# Patient Record
Sex: Female | Born: 1955 | Race: White | Hispanic: No | Marital: Married | Smoking: Never smoker
Health system: Southern US, Community
[De-identification: ages and names within clinical notes are randomized; demographics above are authoritative.]

## PROBLEM LIST (undated history)

## (undated) DIAGNOSIS — M797 Fibromyalgia: Secondary | ICD-10-CM

## (undated) DIAGNOSIS — G43909 Migraine, unspecified, not intractable, without status migrainosus: Secondary | ICD-10-CM

## (undated) DIAGNOSIS — K219 Gastro-esophageal reflux disease without esophagitis: Secondary | ICD-10-CM

## (undated) DIAGNOSIS — N950 Postmenopausal bleeding: Secondary | ICD-10-CM

## (undated) DIAGNOSIS — G4730 Sleep apnea, unspecified: Secondary | ICD-10-CM

## (undated) HISTORY — PX: DILATION AND CURETTAGE OF UTERUS: SHX78

## (undated) HISTORY — DX: Sleep apnea, unspecified: G47.30

## (undated) HISTORY — DX: Migraine, unspecified, not intractable, without status migrainosus: G43.909

## (undated) HISTORY — DX: Fibromyalgia: M79.7

## (undated) HISTORY — PX: FOOT SURGERY: SHX648

## (undated) HISTORY — PX: NASAL SEPTUM SURGERY: SHX37

## (undated) HISTORY — PX: CARPAL TUNNEL RELEASE: SHX101

---

## 2014-11-01 DIAGNOSIS — M797 Fibromyalgia: Secondary | ICD-10-CM | POA: Insufficient documentation

## 2016-01-30 ENCOUNTER — Ambulatory Visit (INDEPENDENT_AMBULATORY_CARE_PROVIDER_SITE_OTHER): Payer: BLUE CROSS/BLUE SHIELD | Admitting: Family Medicine

## 2016-01-30 ENCOUNTER — Encounter: Payer: Self-pay | Admitting: Family Medicine

## 2016-01-30 VITALS — BP 113/79 | HR 87 | Temp 98.4°F | Ht 64.0 in | Wt 212.6 lb

## 2016-01-30 DIAGNOSIS — R232 Flushing: Secondary | ICD-10-CM | POA: Diagnosis not present

## 2016-01-30 DIAGNOSIS — M5136 Other intervertebral disc degeneration, lumbar region: Secondary | ICD-10-CM | POA: Diagnosis not present

## 2016-01-30 DIAGNOSIS — Z130 Encounter for screening for diseases of the blood and blood-forming organs and certain disorders involving the immune mechanism: Secondary | ICD-10-CM

## 2016-01-30 DIAGNOSIS — Z131 Encounter for screening for diabetes mellitus: Secondary | ICD-10-CM

## 2016-01-30 DIAGNOSIS — Z13220 Encounter for screening for lipoid disorders: Secondary | ICD-10-CM

## 2016-01-30 DIAGNOSIS — Z1329 Encounter for screening for other suspected endocrine disorder: Secondary | ICD-10-CM

## 2016-01-30 DIAGNOSIS — M797 Fibromyalgia: Secondary | ICD-10-CM

## 2016-01-30 LAB — COMPREHENSIVE METABOLIC PANEL
ALBUMIN: 3.6 g/dL (ref 3.5–5.2)
ALT: 13 U/L (ref 0–35)
AST: 16 U/L (ref 0–37)
Alkaline Phosphatase: 112 U/L (ref 39–117)
BUN: 12 mg/dL (ref 6–23)
CALCIUM: 9 mg/dL (ref 8.4–10.5)
CHLORIDE: 106 meq/L (ref 96–112)
CO2: 26 mEq/L (ref 19–32)
Creatinine, Ser: 0.65 mg/dL (ref 0.40–1.20)
GFR: 98.44 mL/min (ref >60.00)
Glucose, Bld: 111 mg/dL — ABNORMAL HIGH (ref 70–99)
POTASSIUM: 3.9 meq/L (ref 3.5–5.1)
Sodium: 141 mEq/L (ref 135–145)
Total Bilirubin: 0.3 mg/dL (ref 0.2–1.2)
Total Protein: 7.8 g/dL (ref 6.0–8.3)

## 2016-01-30 LAB — LIPID PANEL
CHOLESTEROL: 173 mg/dL (ref 0–200)
HDL: 47.1 mg/dL (ref >39.00)
LDL CALC: 103 mg/dL — AB (ref 0–99)
NonHDL: 125.96
TRIGLYCERIDES: 117 mg/dL (ref 0.0–149.0)
Total CHOL/HDL Ratio: 4
VLDL: 23.4 mg/dL (ref 0.0–40.0)

## 2016-01-30 LAB — CBC
HEMATOCRIT: 39.9 % (ref 36.0–46.0)
Hemoglobin: 13.5 g/dL (ref 12.0–15.0)
MCHC: 33.8 g/dL (ref 30.0–36.0)
MCV: 93 fl (ref 78.0–100.0)
PLATELETS: 291 10*3/uL (ref 150.0–400.0)
RBC: 4.29 Mil/uL (ref 3.87–5.11)
RDW: 13.2 % (ref 11.5–15.5)
WBC: 7.3 10*3/uL (ref 4.0–10.5)

## 2016-01-30 LAB — TSH: TSH: 3.76 u[IU]/mL (ref 0.35–4.50)

## 2016-01-30 LAB — HEMOGLOBIN A1C: Hgb A1c MFr Bld: 5.6 % (ref 4.6–6.5)

## 2016-01-30 MED ORDER — DULOXETINE HCL 30 MG PO CPEP: 30.0000 mg | ORAL_CAPSULE | Freq: Two times a day (BID) | ORAL | 3 refills | Status: DC

## 2016-01-30 MED ORDER — NAPROXEN 500 MG PO TABS: 500.0000 mg | ORAL_TABLET | Freq: Two times a day (BID) | ORAL | 3 refills | Status: AC

## 2016-01-30 MED ORDER — CYCLOBENZAPRINE HCL 10 MG PO TABS
ORAL_TABLET | ORAL | 1 refills | Status: AC
Start: 2016-01-30 — End: ?

## 2016-01-30 NOTE — Progress Notes (Signed)
Pre visit review using our clinic review tool, if applicable. No additional management support is needed unless otherwise documented below in the visit note. 

## 2016-01-30 NOTE — Progress Notes (Signed)
[facility] [address], [address] [place], [place] [id number] [phone] [phone]  Date:  01/30/2016   Name:  Jessica Herring   DOB:  Jan 08, 1956   MRN:  [id number]  PCP:  Abbe AmsterdamOPLAND,JESSICA, MD    Chief Complaint: Establish Care (Pt here to est care. Pt would like to get new rx for Duloxetine 30 MG and would like rx for Flexeril and naproxen sodium. )   History of Present Illness:  Jessica Herring is a 60 y.o. very pleasant female patient who presents with the following:  Here today as a new patient- she [facility]. History of FBM, migraine, sleep apnea.   She has lived in this area for most of her life.    Most recent labs: she thinks 11/2014.  She is not fasting today.    She is on cymbalta, and she has DDD also.  She has used some of her husband's naproxen and flexeril; this does help with her sx as does heat.  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  She often feels tired after work. She does used CPAP  She has noted possible hot flashes- she went through menopause about 10 years ago and did not have hot flashes until recently.  She has noted that her general tendency towards being "cold natured" has changes and she gets hot more easily  No weight changes over the last 15 years.    She takes cymbalta 30 2 daily. She has used flexeril as needed- maybe once a week after work.  She cannot take it late at night or she cannot get up for work She uses naproxen about twice a day  She does not have any history of elevated cholesterol.  There was a question of WPW in the past- however she had a stress that was negative.  Her stress was done a few years ago-   Last pap was last year, last mammo was about 18  months ago.   She is having some stressors in her marriage- they plan to start marriage counseling which she hopes will be helpful for them  There are no active problems to display for this patient.   Past Medical History:  Diagnosis Date  . Fibromyalgia   . Migraine   . Sleep apnea     No past surgical history on file.  Social History  Substance Use Topics  . Smoking status: Not on file  . Smokeless tobacco: Not on file  . Alcohol use Not on file    No family history on file.  No Known Allergies  Medication list has been reviewed and updated.  No current outpatient prescriptions on file prior to visit.   No current facility-administered medications on file prior to visit.     Review of Systems: Vitals:   01/30/16 0902  BP: 113/79  Pulse: 87  Temp: 98.4 F (36.9 C)    As per HPI- otherwise negative. No CP or SOB She has never had any issues with HTN or CAD   Physical Examination: Ideal Body Weight:    GEN: WDWN, NAD, Non-toxic, A & O x 3, overweight, looks well HEENT: Atraumatic, Normocephalic. Neck supple. No masses,  No LAD.  Bilateral TM wnl, oropharynx normal.  PEERL,EOMI.   Ears and Nose: No external deformity. CV: RRR, No M/G/R. No JVD. No thrill. No extra heart sounds. PULM: CTA B, no wheezes, crackles, rhonchi. No retractions. No resp. distress. No accessory muscle use. EXTR: No c/c/e NEURO Normal gait.  PSYCH: Normally interactive. Conversant. Not depressed or anxious appearing.  Calm demeanor.    Assessment and Plan: Degenerative disc disease, lumbar - Plan: naproxen (NAPROSYN) 500 MG tablet, cyclobenzaprine (FLEXERIL) 10 MG tablet  Fibromyalgia - Plan: DULoxetine (CYMBALTA) 30 MG capsule  Screening for deficiency anemia - Plan: CBC  Screening for diabetes mellitus - Plan: Comprehensive metabolic panel, Hemoglobin A1C  Screening for hyperlipidemia - Plan: Lipid panel  Screening for thyroid disorder - Plan: TSH  Hot flashes - Plan:  TSH  Here today to establish care Medication refills as above She has used some naproxen and flexeril for her back pain; refills these for her today and also her cymbalta Will plan further follow- up pending labs. Plan to do a CPE in the next several months Encouraged mammogram She plans to do her flu shot later in the season as per her usual routine  Signed Abbe Amsterdam, MD

## 2016-01-30 NOTE — Patient Instructions (Addendum)
We will get screening labs for you today- I'll be in touch with your results asap You can use the flexeril as needed for pain- however I could not combine this with Remus Lofflerambien You can use the naproxen as needed for pains as well- watch for any sign of stomach irritation however.   You can have your mammogram done here at our imaging dept if you like- you can give them a call and schedule at your convenience.   [phone]

## 2016-05-06 ENCOUNTER — Telehealth: Payer: Self-pay | Admitting: Family Medicine

## 2016-05-06 DIAGNOSIS — M797 Fibromyalgia: Secondary | ICD-10-CM

## 2016-05-06 MED ORDER — DULOXETINE HCL 60 MG PO CPEP: 60.0000 mg | ORAL_CAPSULE | Freq: Every day | ORAL | 3 refills | Status: DC

## 2016-05-06 NOTE — Telephone Encounter (Signed)
-----   Message from Cammy Copaanesha N [place], [place] sent at 05/06/2016 10:07 AM EST ----- Regarding: DULOXETINE DR 30MG  CAPSULES Received prior authorization letter stating that "Plan does not cover DOSING >1.0 PER DAY.   Would you like me to start a prior authorization to see if we can get it approved? Please advise.

## 2016-05-06 NOTE — Telephone Encounter (Signed)
Called pt and LMOM- I am going to change her cymbalta to 60 mg once a day instead of 30 BID for insurance purposes.  Let me know if any question/ concner

## 2016-05-13 ENCOUNTER — Other Ambulatory Visit: Payer: Self-pay | Admitting: Emergency Medicine

## 2016-05-13 ENCOUNTER — Telehealth: Payer: Self-pay | Admitting: Family Medicine

## 2016-05-13 ENCOUNTER — Encounter: Payer: Self-pay | Admitting: Emergency Medicine

## 2016-05-13 MED ORDER — DULOXETINE HCL 30 MG PO CPEP: 30.0000 mg | ORAL_CAPSULE | Freq: Two times a day (BID) | ORAL | 3 refills | Status: DC

## 2016-05-13 NOTE — Telephone Encounter (Signed)
Pt states that she had tried taking 60 mg capsules in the past and it has made her extremely dizzy. Taking 30 mg capsules twice daily works better per pt. Pt is aware that her insurance does not cover dosing greater than 1 capsule per day. Pt would like to know if we could call her insurance.  I called pt's insurance and was able to get Duloxetine 30 mg capsules BID approved and is good from 05/13/2016 until 04/12/2017. [place] approval #[id number].  Called pharmacy and pt to inform of this approval.

## 2016-05-13 NOTE — Telephone Encounter (Signed)
-----   Message from Cammy Copaanesha N [place], [place] sent at 05/13/2016 10:33 AM EST ----- Pt states that she had tried taking 60 mg capsules in the past and it has made her extremely dizzy. Taking 30 mg capsules twice daily works better per pt. Pt is aware that her insurance does not cover dosing greater than 1 capsule per day. Pt would like to know if we could call her insurance.  I called pt's insurance and was able to get Duloxetine 30 mg capsules BID approved and is good from 05/13/2016 until 04/12/2017. PA approval #[id number].  Called pharmacy and pt to inform of this approval.

## 2016-08-18 ENCOUNTER — Telehealth: Payer: Self-pay | Admitting: Family Medicine

## 2016-08-18 NOTE — Telephone Encounter (Signed)
Caller name:Aijah Mathison Relationship to patient:[phone] Can be reached: Pharmacy:[facility] on [address] [place]  Reason for call:Requesting refill on Duloxetine , having pain in leg pain, thinks its her sciatica, whom should she go to get this addressed? Has seen Dr Freddi Che and stated she has DDD. Unable to handle this pain, please advise.

## 2016-08-19 MED ORDER — DULOXETINE HCL 30 MG PO CPEP: 30.0000 mg | ORAL_CAPSULE | Freq: Two times a day (BID) | ORAL | 5 refills | Status: DC

## 2016-08-19 NOTE — Telephone Encounter (Signed)
Called her back- I refilled her cymbalta.  Discussed her back pain- she may follow-up with her orthopedist Dr. Wyline Mood or see me for evaluation

## 2016-08-19 NOTE — Telephone Encounter (Signed)
Patient is calling back to follow up on message left yesterday. Request call back to inform her if she needs to come in.

## 2016-08-19 NOTE — Telephone Encounter (Signed)
Called pt to inform that provider is out of the office today but that her message would be sent

## 2016-12-03 ENCOUNTER — Encounter: Payer: Self-pay | Admitting: Family Medicine

## 2016-12-03 ENCOUNTER — Ambulatory Visit (INDEPENDENT_AMBULATORY_CARE_PROVIDER_SITE_OTHER): Payer: 59 | Admitting: Family Medicine

## 2016-12-03 VITALS — BP 131/60 | HR 77 | Temp 98.1°F | Ht 64.0 in | Wt 224.2 lb

## 2016-12-03 DIAGNOSIS — N950 Postmenopausal bleeding: Secondary | ICD-10-CM

## 2016-12-03 DIAGNOSIS — R040 Epistaxis: Secondary | ICD-10-CM

## 2016-12-03 NOTE — Progress Notes (Signed)
Pre visit review using our clinic tool,if applicable. No additional management support is needed unless otherwise documented below in the visit note.  

## 2016-12-03 NOTE — Patient Instructions (Signed)
We are going to have you see an OB/GYN as soon as possible!  You need evaluation of your bleeding which may include an endometrial biopsy, pelvic ultrasound, or CT scan.   Let me know if you have any other problems or concerns in the meantime, or if you have any heavy bleeding that concerns you.    I think your nosebleed was likely a coincidence, but if this happens again please alert me

## 2016-12-03 NOTE — Progress Notes (Addendum)
[facility] [address], [address] [place], [place] [address] [phone]  Date:  12/03/2016   Name:  Jessica Herring   DOB:  04/01/1956   MRN:  [id number]  PCP:  Pearline Cables, MD    Chief Complaint: Vaginal Bleeding (Spotting)   History of Present Illness:  Jessica Herring is a 61 y.o. very pleasant female patient who presents with the following:  She is here today for a follow-up visit She went to the beach several times over the summer which was great She has noted some vaginal spotting for about 3 few days- was just a little pink at first but became more distinct so she came in She tends to wear a liner/ mini pad anyway in case of any urinary spotting Never had any bleeding since menopause  Never had any GYN surgery She has note noted any urinary sx except for "a little bit of burning, just one time," this has not persisted and she does not suspect a UTI currently   Never had any abnl pap in the past.  Last pap about 18 months ago  She does feel that the bleeding is vaginal and not rectal  She is otherwise feeling well, except she had a nosebleed during the night last night.  This is not unheard of for her and she was able to get it under control No other unusual bleeding or bruising She is not taking any blood thinners   Patient Active Problem List   Diagnosis Date Noted  . Fibromyalgia 11/01/2014    Past Medical History:  Diagnosis Date  . Fibromyalgia   . Migraine   . Sleep apnea     Past Surgical History:  Procedure Laterality Date  . CARPAL TUNNEL RELEASE    . FOOT SURGERY    . NASAL SEPTUM SURGERY      Social History  Substance Use Topics  . Smoking status: Not on file  . Smokeless tobacco: Not on file  . Alcohol use Yes    Family History  Problem Relation Age of Onset  . Lung cancer Mother   . Lupus Mother   . Stroke Father        cause of death  . Heart disease Paternal Grandfather      No Known Allergies  Medication list has been reviewed and updated.  Current Outpatient Prescriptions on File Prior to Visit  Medication Sig Dispense Refill  . cyclobenzaprine (FLEXERIL) 10 MG tablet Take 1 daily as needed for pain 30 tablet 1  . DULoxetine (CYMBALTA) 30 MG capsule Take 1 capsule (30 mg total) by mouth 2 (two) times daily. 60 capsule 5  . ipratropium (ATROVENT) 0.06 % nasal spray Place 2 sprays into both nostrils 4 (four) times daily as needed for rhinitis.    . meloxicam (MOBIC) 15 MG tablet Take 15 mg by mouth daily as needed for pain.    . montelukast (SINGULAIR) 10 MG tablet Take 10 mg by mouth at bedtime.    . naproxen (NAPROSYN) 500 MG tablet Take 1 tablet (500 mg total) by mouth 2 (two) times daily with a meal. 60 tablet 3  . TURMERIC PO Take 1 tablet by mouth daily.    Marland Kitchen zolpidem (AMBIEN) 5 MG tablet Take 1 tablet (5 mg total) by mouth nightly as needed for sleep.     No current facility-administered medications on file prior to visit.     Review of Systems:  As per HPI- otherwise negative.   Physical Examination: Vitals:   12/03/16 1512  BP: 131/60  Pulse: 77  Temp: 98.1 F (36.7 C)  SpO2: 99%   Vitals:   12/03/16 1512  Weight: 224 lb 3.2 oz (101.7 kg)  Height: 5\' 4"  (1.626 m)   Body mass index is 38.48 kg/m. Ideal Body Weight: Weight in (lb) to have BMI = 25: 145.3  GEN: WDWN, NAD, Non-toxic, A & O x 3 HEENT: Atraumatic, Normocephalic. Neck supple. No masses, No LAD. Ears and Nose: No external deformity. CV: RRR, No M/G/R. No JVD. No thrill. No extra heart sounds. PULM: CTA B, no wheezes, crackles, rhonchi. No retractions. No resp. distress. No accessory muscle use. ABD: S, NT, ND, +BS. No rebound. No HSM. EXTR: No c/c/e NEURO Normal gait.  PSYCH: Normally interactive. Conversant. Not depressed or anxious appearing.  Calm demeanor.  Obese, otherwise looks well Pelvic: no vaginal lesions or discharge. Uterus normal, no CMT, no adnexal  tendereness or masses. However she is clearly having uterine bleeding with dark blood present at the cervix and in the vault. No cervical lesion or polyp noted Bleeding is not heavy    Assessment and Plan: Post-menopausal bleeding - Plan: Ambulatory referral to Obstetrics / Gynecology, CBC  Epistaxis  Here today with post- menopausal bleeding.  Explained to pt that this is concerning for endometrial cancer until proven otherwise.  Will set her up to see GYN asap Had planned to collect pap today but bleeding will likely obscure sample She did have a nosebleed last night- this went away and may be coincidental, but she will let me know if she has any other concerning sx or if her vaginal bleeding becomes heavy  Signed Abbe AmsterdamJessica Luba Matzen, MD  Results for orders placed or performed in visit on 12/03/16  CBC  Result Value Ref Range   WBC 10.3 4.0 - 10.5 K/uL   RBC 4.34 3.87 - 5.11 Mil/uL   Platelets 293.0 150.0 - 400.0 K/uL   Hemoglobin 13.9 12.0 - 15.0 g/dL   HCT 52.842.4 41.336.0 - 24.446.0 %   MCV 97.7 78.0 - 100.0 fl   MCHC 32.7 30.0 - 36.0 g/dL   RDW 01.013.7 27.211.5 - 53.615.5 %

## 2016-12-04 LAB — CBC
HEMATOCRIT: 42.4 % (ref 36.0–46.0)
Hemoglobin: 13.9 g/dL (ref 12.0–15.0)
MCHC: 32.7 g/dL (ref 30.0–36.0)
MCV: 97.7 fl (ref 78.0–100.0)
Platelets: 293 10*3/uL (ref 150.0–400.0)
RBC: 4.34 Mil/uL (ref 3.87–5.11)
RDW: 13.7 % (ref 11.5–15.5)
WBC: 10.3 10*3/uL (ref 4.0–10.5)

## 2016-12-10 ENCOUNTER — Ambulatory Visit (INDEPENDENT_AMBULATORY_CARE_PROVIDER_SITE_OTHER): Payer: 59 | Admitting: Obstetrics & Gynecology

## 2016-12-10 ENCOUNTER — Encounter: Payer: Self-pay | Admitting: Obstetrics & Gynecology

## 2016-12-10 VITALS — BP 128/64 | HR 82 | Ht 64.0 in | Wt 222.0 lb

## 2016-12-10 DIAGNOSIS — N950 Postmenopausal bleeding: Secondary | ICD-10-CM

## 2016-12-10 MED ORDER — MISOPROSTOL 200 MCG PO TABS: ORAL_TABLET | ORAL | 0 refills | Status: DC

## 2016-12-10 NOTE — Progress Notes (Signed)
Pt has been seen by Dr Patsy Lager for problem and was referred over. Pt states bleeding has occurred 1 week - 10days. Pt states bleeding has been light since her visit with PCP.

## 2016-12-10 NOTE — Progress Notes (Signed)
   Subjective:    Patient ID: Jessica Herring, female    DOB: 1955/05/07, 61 y.o.   MRN: [id number]  HPI  61 yo MW G0 here for PMB over the last 2 weeks. Menopausal about 61 yo.   Review of Systems Pap utd and normal    Objective:   Physical Exam Pleasant Well nourished, well hydrated white female, no apparent distress Breathing, conversing, and ambulating normally Cervix- stenotic, no lesions      Assessment & Plan:  PMB- schedule gyn u/s EMBX at next visit Pretreat with cytotec

## 2016-12-16 ENCOUNTER — Ambulatory Visit (HOSPITAL_BASED_OUTPATIENT_CLINIC_OR_DEPARTMENT_OTHER)
Admission: RE | Admit: 2016-12-16 | Discharge: 2016-12-16 | Disposition: A | Discharge status: Home / Self Care | Payer: 59 | Source: Ambulatory Visit | Attending: Obstetrics & Gynecology | Admitting: Obstetrics & Gynecology

## 2016-12-16 ENCOUNTER — Encounter (HOSPITAL_BASED_OUTPATIENT_CLINIC_OR_DEPARTMENT_OTHER): Payer: Self-pay

## 2016-12-16 DIAGNOSIS — N950 Postmenopausal bleeding: Secondary | ICD-10-CM | POA: Diagnosis not present

## 2016-12-18 ENCOUNTER — Encounter (HOSPITAL_COMMUNITY): Payer: Self-pay

## 2016-12-18 ENCOUNTER — Ambulatory Visit (INDEPENDENT_AMBULATORY_CARE_PROVIDER_SITE_OTHER): Payer: 59 | Admitting: Obstetrics & Gynecology

## 2016-12-18 ENCOUNTER — Encounter: Payer: Self-pay | Admitting: Obstetrics & Gynecology

## 2016-12-18 VITALS — BP 129/82 | HR 82 | Ht 64.0 in | Wt 223.0 lb

## 2016-12-18 DIAGNOSIS — N950 Postmenopausal bleeding: Secondary | ICD-10-CM

## 2016-12-18 NOTE — Progress Notes (Signed)
   Subjective:    Patient ID: Jessica Herring, female    DOB: [birth year], 61 y.o.   MRN: [id number]  HPI 61 yo MW G0 here for PMB over the last 2 weeks. Menopausal about 61 yo. Her u/s showed a 3 mm uterine lining, but fluid was present in the endometrial canal.  Review of Systems     Objective:   Physical Exam Well nourished, well hydrated white female, no apparent distress Breathing, conversing, and ambulating normally Cervix still stenotic (even after cytotec) that I cannot find a metal dilator here at the office small enough to enter the cervix. The plastic skinny one just bends when pushed at the os.       Assessment & Plan:  PMB-with normal endometrium and VERY stenotic cervix, I have told her that I will do research to see if she truly needs [facility]

## 2016-12-19 ENCOUNTER — Telehealth: Payer: Self-pay

## 2016-12-19 MED ORDER — MISOPROSTOL 200 MCG PO TABS: ORAL_TABLET | ORAL | 0 refills | Status: AC

## 2016-12-19 NOTE — Telephone Encounter (Signed)
Patient called and made aware that Dr. Marice Potterove will need to attempt the Endometrial biopsy in the Operating room. Patient made aware that she will be scheduled and a letter will be sent. (most likely not until Oct 2018). Patient also made aware that she will need to repeat the Cytotec 600mcg the night before her surgery - made her aware I will send a refill to her pharmacy and she can pick it up and just hold it until the night before her surgery. Patient states understanding. Armandina StammerJennifer Howard RNBSN

## 2017-01-19 ENCOUNTER — Encounter (HOSPITAL_BASED_OUTPATIENT_CLINIC_OR_DEPARTMENT_OTHER): Payer: Self-pay | Admitting: *Deleted

## 2017-01-22 ENCOUNTER — Other Ambulatory Visit: Payer: Self-pay

## 2017-01-22 ENCOUNTER — Encounter (HOSPITAL_BASED_OUTPATIENT_CLINIC_OR_DEPARTMENT_OTHER)
Admission: RE | Admit: 2017-01-22 | Discharge: 2017-01-22 | Disposition: A | Discharge status: Home / Self Care | Payer: 59 | Source: Ambulatory Visit | Attending: Obstetrics & Gynecology | Admitting: Obstetrics & Gynecology

## 2017-01-22 ENCOUNTER — Telehealth: Payer: Self-pay

## 2017-01-22 DIAGNOSIS — Z01812 Encounter for preprocedural laboratory examination: Secondary | ICD-10-CM | POA: Diagnosis not present

## 2017-01-22 DIAGNOSIS — N950 Postmenopausal bleeding: Secondary | ICD-10-CM | POA: Insufficient documentation

## 2017-01-22 NOTE — Telephone Encounter (Signed)
Patient called and she has already picked up the prescription for her cytotec and has it ready to take by mouth the night before surgery. Armandina Stammer RNBSN

## 2017-01-22 NOTE — Progress Notes (Signed)
Dr.Turk reviewed EKG- ok for surgery. Also Dr. Desmond Lope did Anesthesia Consult for OSA and pt does not use CPAP, spoke with pt and checked airway- Ok for surgery.

## 2017-01-22 NOTE — Telephone Encounter (Signed)
-----   Message from Allie Bossier, MD sent at 01/21/2017 12:55 PM EDT ----- Can you please make sure that she takes cytotec ORALLY 600 mcg the night before her d&c. Thanks

## 2017-01-28 ENCOUNTER — Ambulatory Visit (HOSPITAL_BASED_OUTPATIENT_CLINIC_OR_DEPARTMENT_OTHER)
Admission: RE | Admit: 2017-01-28 | Discharge: 2017-01-28 | Disposition: A | Discharge status: Home / Self Care | Payer: 59 | Source: Ambulatory Visit | Attending: Obstetrics & Gynecology | Admitting: Obstetrics & Gynecology

## 2017-01-28 ENCOUNTER — Encounter (HOSPITAL_BASED_OUTPATIENT_CLINIC_OR_DEPARTMENT_OTHER): Payer: Self-pay | Admitting: Anesthesiology

## 2017-01-28 ENCOUNTER — Ambulatory Visit (HOSPITAL_BASED_OUTPATIENT_CLINIC_OR_DEPARTMENT_OTHER): Payer: 59 | Admitting: Anesthesiology

## 2017-01-28 ENCOUNTER — Encounter (HOSPITAL_BASED_OUTPATIENT_CLINIC_OR_DEPARTMENT_OTHER)
Admission: RE | Disposition: A | Discharge status: Home / Self Care | Payer: Self-pay | Source: Ambulatory Visit | Attending: Obstetrics & Gynecology

## 2017-01-28 DIAGNOSIS — Z91018 Allergy to other foods: Secondary | ICD-10-CM | POA: Diagnosis not present

## 2017-01-28 DIAGNOSIS — M797 Fibromyalgia: Secondary | ICD-10-CM | POA: Diagnosis not present

## 2017-01-28 DIAGNOSIS — G4730 Sleep apnea, unspecified: Secondary | ICD-10-CM | POA: Diagnosis not present

## 2017-01-28 DIAGNOSIS — N950 Postmenopausal bleeding: Secondary | ICD-10-CM | POA: Insufficient documentation

## 2017-01-28 DIAGNOSIS — N858 Other specified noninflammatory disorders of uterus: Secondary | ICD-10-CM | POA: Insufficient documentation

## 2017-01-28 DIAGNOSIS — K219 Gastro-esophageal reflux disease without esophagitis: Secondary | ICD-10-CM | POA: Insufficient documentation

## 2017-01-28 DIAGNOSIS — Z79899 Other long term (current) drug therapy: Secondary | ICD-10-CM | POA: Insufficient documentation

## 2017-01-28 HISTORY — DX: Postmenopausal bleeding: N95.0

## 2017-01-28 HISTORY — DX: Gastro-esophageal reflux disease without esophagitis: K21.9

## 2017-01-28 HISTORY — PX: HYSTEROSCOPY WITH D & C: SHX1775

## 2017-01-28 SURGERY — DILATATION AND CURETTAGE /HYSTEROSCOPY
Anesthesia: General

## 2017-01-28 MED ORDER — BUPIVACAINE HCL (PF) 0.5 % IJ SOLN
INTRAMUSCULAR | Status: DC | PRN
  Administered 2017-01-28: 20 mL

## 2017-01-28 MED ORDER — DEXAMETHASONE SODIUM PHOSPHATE 10 MG/ML IJ SOLN
INTRAMUSCULAR | Status: AC
  Filled 2017-01-28: qty 1

## 2017-01-28 MED ORDER — LIDOCAINE 2% (20 MG/ML) 5 ML SYRINGE
INTRAMUSCULAR | Status: AC
  Filled 2017-01-28: qty 5

## 2017-01-28 MED ORDER — FENTANYL CITRATE (PF) 100 MCG/2ML IJ SOLN
50.0000 ug | INTRAMUSCULAR | Status: DC | PRN
  Administered 2017-01-28: 100 ug via INTRAVENOUS

## 2017-01-28 MED ORDER — ONDANSETRON HCL 4 MG/2ML IJ SOLN
INTRAMUSCULAR | Status: DC | PRN
  Administered 2017-01-28: 4 mg via INTRAVENOUS

## 2017-01-28 MED ORDER — MIDAZOLAM HCL 2 MG/2ML IJ SOLN
1.0000 mg | INTRAMUSCULAR | Status: DC | PRN
  Administered 2017-01-28: 2 mg via INTRAVENOUS

## 2017-01-28 MED ORDER — KETOROLAC TROMETHAMINE 30 MG/ML IJ SOLN
INTRAMUSCULAR | Status: AC
  Filled 2017-01-28: qty 1

## 2017-01-28 MED ORDER — SCOPOLAMINE 1 MG/3DAYS TD PT72: 1.0000 | MEDICATED_PATCH | Freq: Once | TRANSDERMAL | Status: DC | PRN

## 2017-01-28 MED ORDER — FENTANYL CITRATE (PF) 100 MCG/2ML IJ SOLN: 25.0000 ug | INTRAMUSCULAR | Status: DC | PRN

## 2017-01-28 MED ORDER — KETOROLAC TROMETHAMINE 30 MG/ML IJ SOLN
INTRAMUSCULAR | Status: DC | PRN
  Administered 2017-01-28: 30 mg via INTRAVENOUS

## 2017-01-28 MED ORDER — ONDANSETRON HCL 4 MG/2ML IJ SOLN
INTRAMUSCULAR | Status: AC
  Filled 2017-01-28: qty 2

## 2017-01-28 MED ORDER — SODIUM CHLORIDE 0.9 % IR SOLN
Status: DC | PRN
  Administered 2017-01-28: 1

## 2017-01-28 MED ORDER — MIDAZOLAM HCL 2 MG/2ML IJ SOLN
INTRAMUSCULAR | Status: AC
  Filled 2017-01-28: qty 2

## 2017-01-28 MED ORDER — LIDOCAINE 2% (20 MG/ML) 5 ML SYRINGE
INTRAMUSCULAR | Status: DC | PRN
  Administered 2017-01-28: 100 mg via INTRAVENOUS

## 2017-01-28 MED ORDER — DEXAMETHASONE SODIUM PHOSPHATE 4 MG/ML IJ SOLN
INTRAMUSCULAR | Status: DC | PRN
  Administered 2017-01-28: 10 mg via INTRAVENOUS

## 2017-01-28 MED ORDER — PROPOFOL 10 MG/ML IV BOLUS
INTRAVENOUS | Status: DC | PRN
  Administered 2017-01-28: 200 mg via INTRAVENOUS

## 2017-01-28 MED ORDER — FENTANYL CITRATE (PF) 100 MCG/2ML IJ SOLN
INTRAMUSCULAR | Status: AC
  Filled 2017-01-28: qty 2

## 2017-01-28 MED ORDER — LACTATED RINGERS IV SOLN
INTRAVENOUS | Status: DC
  Administered 2017-01-28: 10:00:00 via INTRAVENOUS

## 2017-01-28 SURGICAL SUPPLY — 18 items
BIPOLAR CUTTING LOOP 21FR (ELECTRODE)
CANISTER SUCT 3000ML PPV (MISCELLANEOUS) ×3 IMPLANT
CLOTH BEACON ORANGE TIMEOUT ST (SAFETY) ×3 IMPLANT
CONTAINER PREFILL 10% NBF 60ML (FORM) ×6 IMPLANT
DILATOR CANAL MILEX (MISCELLANEOUS) ×3 IMPLANT
ELECT REM PT RETURN 9FT ADLT (ELECTROSURGICAL)
ELECTRODE REM PT RTRN 9FT ADLT (ELECTROSURGICAL) IMPLANT
GLOVE BIO SURGEON STRL SZ 6.5 (GLOVE) ×2 IMPLANT
GLOVE BIO SURGEONS STRL SZ 6.5 (GLOVE) ×1
GLOVE BIOGEL PI IND STRL 7.0 (GLOVE) ×1 IMPLANT
GLOVE BIOGEL PI INDICATOR 7.0 (GLOVE) ×2
GOWN STRL REUS W/TWL LRG LVL3 (GOWN DISPOSABLE) ×6 IMPLANT
LOOP CUTTING BIPOLAR 21FR (ELECTRODE) IMPLANT
PACK VAGINAL MINOR WOMEN LF (CUSTOM PROCEDURE TRAY) ×3 IMPLANT
PAD OB MATERNITY 4.3X12.25 (PERSONAL CARE ITEMS) ×3 IMPLANT
TOWEL OR 17X24 6PK STRL BLUE (TOWEL DISPOSABLE) ×6 IMPLANT
TUBING AQUILEX INFLOW (TUBING) ×3 IMPLANT
TUBING AQUILEX OUTFLOW (TUBING) ×3 IMPLANT

## 2017-01-28 NOTE — Anesthesia Preprocedure Evaluation (Addendum)
Anesthesia Evaluation  Patient identified by MRN, date of birth, ID band Patient awake    Reviewed: Allergy & Precautions, H&P , Patient's Chart, lab work & pertinent test results, reviewed documented beta blocker date and time   Airway Mallampati: II  TM Distance: >3 FB Neck ROM: full    Dental no notable dental hx.    Pulmonary    Pulmonary exam normal breath sounds clear to auscultation       Cardiovascular  Rhythm:regular Rate:Normal     Neuro/Psych    GI/Hepatic   Endo/Other    Renal/GU      Musculoskeletal   Abdominal   Peds  Hematology   Anesthesia Other Findings   Reproductive/Obstetrics                             Anesthesia Physical Anesthesia Plan  ASA: III  Anesthesia Plan: General   Post-op Pain Management:    Induction: Intravenous  PONV Risk Score and Plan: 2 and Ondansetron, Dexamethasone and Treatment may vary due to age or medical condition  Airway Management Planned: LMA  Additional Equipment:   Intra-op Plan:   Post-operative Plan:   Informed Consent: I have reviewed the patients History and Physical, chart, labs and discussed the procedure including the risks, benefits and alternatives for the proposed anesthesia with the patient or authorized representative who has indicated his/her understanding and acceptance.   Dental Advisory Given  Plan Discussed with: CRNA and Surgeon  Anesthesia Plan Comments: ( )       Anesthesia Quick Evaluation

## 2017-01-28 NOTE — Discharge Instructions (Signed)
Dilation and Curettage or Vacuum Curettage, Care After °This sheet gives you information about how to care for yourself after your procedure. Your health care provider may also give you more specific instructions. If you have problems or questions, contact your health care provider. °What can I expect after the procedure? °After your procedure, it is common to have: °· Mild pain or cramping. °· Some vaginal bleeding or spotting. ° °These may last for up to 2 weeks after your procedure. °Follow these instructions at home: °Activity ° °· Do not drive or use heavy machinery while taking prescription pain medicine. °· Avoid driving for the first 24 hours after your procedure. °· Take frequent, short walks, followed by rest periods, throughout the day. Ask your health care provider what activities are safe for you. After 1-2 days, you may be able to return to your normal activities. °· Do not lift anything heavier than 10 lb (4.5 kg) until your health care provider approves. °· For at least 2 weeks, or as long as told by your health care provider, do not: °? Douche. °? Use tampons. °? Have sexual intercourse. °General instructions ° °· Take over-the-counter and prescription medicines only as told by your health care provider. This is especially important if you take blood thinning medicine. °· Do not take baths, swim, or use a hot tub until your health care provider approves. Take showers instead of baths. °· Wear compression stockings as told by your health care provider. These stockings help to prevent blood clots and reduce swelling in your legs. °· It is your responsibility to get the results of your procedure. Ask your health care provider, or the department performing the procedure, when your results will be ready. °· Keep all follow-up visits as told by your health care provider. This is important. °Contact a health care provider if: °· You have severe cramps that get worse or that do not get better with  medicine. °· You have severe abdominal pain. °· You cannot drink fluids without vomiting. °· You develop pain in a different area of your pelvis. °· You have bad-smelling vaginal discharge. °· You have a rash. °Get help right away if: °· You have vaginal bleeding that soaks more than one sanitary pad in 1 hour, for 2 hours in a row. °· You pass large blood clots from your vagina. °· You have a fever that is above 100.4°F (38.0°C). °· Your abdomen feels very tender or hard. °· You have chest pain. °· You have shortness of breath. °· You cough up blood. °· You feel dizzy or light-headed. °· You faint. °· You have pain in your neck or shoulder area. °This information is not intended to replace advice given to you by your health care provider. Make sure you discuss any questions you have with your health care provider. °Document Released: 04/04/2000 Document Revised: 12/05/2015 Document Reviewed: 11/08/2015 °Elsevier Interactive Patient Education © 2018 [employer] ° ° °Post Anesthesia Home Care Instructions ° °Activity: °Get plenty of rest for the remainder of the day. A responsible individual must stay with you for 24 hours following the procedure.  °For the next 24 hours, DO NOT: °-Drive a car °-Operate machinery °-Drink alcoholic beverages °-Take any medication unless instructed by your physician °-Make any legal decisions or sign important papers. ° °Meals: °Start with liquid foods such as gelatin or soup. Progress to regular foods as tolerated. Avoid greasy, spicy, heavy foods. If nausea and/or vomiting occur, drink only clear liquids until the nausea   and/or vomiting subsides. Call your physician if vomiting continues. ° °Special Instructions/Symptoms: °Your throat may feel dry or sore from the anesthesia or the breathing tube placed in your throat during surgery. If this causes discomfort, gargle with warm salt water. The discomfort should disappear within 24 hours. ° °If you had a scopolamine patch placed  behind your ear for the management of post- operative nausea and/or vomiting: ° °1. The medication in the patch is effective for 72 hours, after which it should be removed.  Wrap patch in a tissue and discard in the trash. Wash hands thoroughly with soap and water. °2. You may remove the patch earlier than 72 hours if you experience unpleasant side effects which may include dry mouth, dizziness or visual disturbances. °3. Avoid touching the patch. Wash your hands with soap and water after contact with the patch. °  ° ° °

## 2017-01-28 NOTE — Anesthesia Postprocedure Evaluation (Signed)
Anesthesia Post Note  Patient: Jessica Herring  Procedure(s) Performed: DILATATION AND CURETTAGE /HYSTEROSCOPY (N/A )     Patient location during evaluation: PACU Anesthesia Type: General Level of consciousness: awake and alert Pain management: pain level controlled Vital Signs Assessment: post-procedure vital signs reviewed and stable Respiratory status: spontaneous breathing, nonlabored ventilation, respiratory function stable and patient connected to nasal cannula oxygen Cardiovascular status: blood pressure returned to baseline and stable Postop Assessment: no apparent nausea or vomiting Anesthetic complications: no    Last Vitals:  Vitals:   01/28/17 1200 01/28/17 1220  BP: 101/61 (!) 142/76  Pulse: 70 71  Resp: 16 18  Temp:  36.4 C  SpO2: 94% 100%    Last Pain:  Vitals:   01/28/17 1220  TempSrc: Oral  PainSc: 0-No pain                 Undray Allman EDWARD

## 2017-01-28 NOTE — Transfer of Care (Signed)
Immediate Anesthesia Transfer of Care Note  Patient: Jessica Herring  Procedure(s) Performed: DILATATION AND CURETTAGE /HYSTEROSCOPY (N/A )  Patient Location: PACU  Anesthesia Type:General  Level of Consciousness: drowsy  Airway & Oxygen Therapy: Patient Spontanous Breathing and Patient connected to face mask oxygen  Post-op Assessment: Report given to RN and Post -op Vital signs reviewed and stable  Post vital signs: Reviewed and stable  Last Vitals:  Vitals:   01/28/17 1008  BP: (!) 105/52  Pulse: 79  Resp: 17  Temp: 36.7 C  SpO2: 98%    Last Pain:  Vitals:   01/28/17 1008  TempSrc: Oral  PainSc: 1       Patients Stated Pain Goal: 1 (01/28/17 1008)  Complications: No apparent anesthesia complications

## 2017-01-28 NOTE — H&P (Signed)
Jessica Herring is an 61 yo MW G0 here for PMB over the last 2 weeks. Menopausal about 61 yo. Her u/s showed a 3 mm uterine lining, but fluid was present in the endometrial canal.   No LMP recorded. Patient is postmenopausal.    Past Medical History:  Diagnosis Date  . Fibromyalgia   . GERD (gastroesophageal reflux disease)   . Migraine   . PMB (postmenopausal bleeding)   . Sleep apnea    does not use CPAP    Past Surgical History:  Procedure Laterality Date  . CARPAL TUNNEL RELEASE    . DILATION AND CURETTAGE OF UTERUS     8-10 week missed AB per patient  . FOOT SURGERY    . NASAL SEPTUM SURGERY      Family History  Problem Relation Age of Onset  . Lung cancer Mother   . Lupus Mother   . Stroke Father        cause of death  . Heart disease Paternal Grandfather     Social History:  reports that she has never smoked. She has never used smokeless tobacco. She reports that she drinks alcohol. She reports that she does not use drugs.  Allergies:  Allergies  Allergen Reactions  . Banana Nausea And Vomiting  . Onion Nausea And Vomiting  . Tomato Hives    Prescriptions Prior to Admission  Medication Sig Dispense Refill Last Dose  . calcium carbonate (TUMS) 500 MG chewable tablet Chew 1 tablet by mouth daily.   Past Week at Unknown time  . cyclobenzaprine (FLEXERIL) 10 MG tablet Take 1 daily as needed for pain 30 tablet 1 Past Month at Unknown time  . DULoxetine (CYMBALTA) 30 MG capsule Take 1 capsule (30 mg total) by mouth 2 (two) times daily. 60 capsule 5 01/27/2017 at Unknown time  . gabapentin (NEURONTIN) 300 MG capsule Take 300 mg by mouth 3 (three) times daily.    01/28/2017 at 0730  . misoprostol (CYTOTEC) 200 MCG tablet Take 3 pills by mouth the night before surgery. 3 tablet 0 01/27/2017 at Unknown time  . naproxen (NAPROSYN) 500 MG tablet Take 1 tablet (500 mg total) by mouth 2 (two) times daily with a meal. 60 tablet 3 Past Week at Unknown time  . ipratropium  (ATROVENT) 0.06 % nasal spray Place 2 sprays into both nostrils 4 (four) times daily as needed for rhinitis.   More than a month at Unknown time  . montelukast (SINGULAIR) 10 MG tablet Take 10 mg by mouth at bedtime.   More than a month at Unknown time    ROS  Height  (1.626 m), weight 101.2 kg (223 lb). Physical Exam  Heart- rrr Lungs- CTAB Abd- benign  No results found for this or any previous visit (from the past 24 hour(s)).  No results found.  Assessment/Plan: PMB with fluid in the endometrial canal. I have rec'd a d&c and hysteroscopy.  She understands the risks of surgery, including, but not to infection, bleeding, DVTs, damage to bowel, bladder, ureters. She wishes to proceed.      Allie Bossier 01/28/2017, 9:53 AM

## 2017-01-28 NOTE — Op Note (Signed)
01/28/2017  11:07 AM  PATIENT:  Jessica Herring  61 y.o. female  PRE-OPERATIVE DIAGNOSIS:  PMB  POST-OPERATIVE DIAGNOSIS:  PMB  PROCEDURE:  Procedure(s): DILATATION AND CURETTAGE /HYSTEROSCOPY (N/A)  SURGEON:  Surgeon(s) and Role:    * Julieta Rogalski C, MD - Primary   ANESTHESIA:   local and general  EBL:  Total I/O In: 400 [I.V.:400] Out: -   BLOOD ADMINISTERED:none  DRAINS: none   LOCAL MEDICATIONS USED:  MARCAINE     SPECIMEN:  Source of Specimen:  uterine curettings  DISPOSITION OF SPECIMEN:  PATHOLOGY  COUNTS:  YES  TOURNIQUET:  * No tourniquets in log *  DICTATION: .Dragon Dictation  PLAN OF CARE: Discharge to home after PACU  PATIENT DISPOSITION:  PACU - hemodynamically stable.   Delay start of Pharmacological VTE agent (>24hrs) due to surgical blood loss or risk of bleeding: not applicable    The risks, benefits, and alternatives of surgery were explained, understood, and accepted. All questions were answered. Consents were signed. In the operating room general anesthesia was applied without complication, and she was placed in the dorsal lithotomy position. Her vagina was prepped and draped in the usual sterile fashion. A bimanual exam revealed a small uterus and non-palpable adnexa.  A Pederson speculum was placed and a single-tooth tenaculum was used to grasp the anterior lip of her cervix. A total of 20 mL of 0.5% Marcaine was used to perform a paracervical block. Her uterus sounded to 6 cm. Her cervix was carefully and slowly dilated to accommodate a small curette. As I dilated her cervix, a small amount of old dark blood was noted coming out of the cervix. Hysteroscopy showed a mostly atrophic endometrium with a few areas of normal-appearing shagginess.  A curettage was done in all quadrants and the fundus of the uterus. A small amount of tissue was obtained. A gritty sensation was appreciated throughout. There was no bleeding noted at the end of the case. She  was taken to the recovery room after being extubated. She tolerated the procedure well.

## 2017-01-28 NOTE — Anesthesia Procedure Notes (Signed)
Procedure Name: LMA Insertion Date/Time: 01/28/2017 10:41 AM Performed by: Caren Macadam Pre-anesthesia Checklist: Patient identified, Emergency Drugs available, Suction available and Patient being monitored Patient Re-evaluated:Patient Re-evaluated prior to induction Oxygen Delivery Method: Circle system utilized Preoxygenation: Pre-oxygenation with 100% oxygen Induction Type: IV induction Ventilation: Mask ventilation without difficulty LMA: LMA inserted LMA Size: 4.0 Number of attempts: 1 Airway Equipment and Method: Bite block Placement Confirmation: positive ETCO2 and breath sounds checked- equal and bilateral Tube secured with: Tape Dental Injury: Teeth and Oropharynx as per pre-operative assessment

## 2017-01-29 ENCOUNTER — Encounter (HOSPITAL_BASED_OUTPATIENT_CLINIC_OR_DEPARTMENT_OTHER): Payer: Self-pay | Admitting: Obstetrics & Gynecology

## 2017-02-25 ENCOUNTER — Ambulatory Visit (INDEPENDENT_AMBULATORY_CARE_PROVIDER_SITE_OTHER): Payer: 59 | Admitting: Obstetrics & Gynecology

## 2017-02-25 ENCOUNTER — Encounter: Payer: Self-pay | Admitting: Obstetrics & Gynecology

## 2017-02-25 VITALS — BP 110/81 | HR 75 | Wt 223.0 lb

## 2017-02-25 DIAGNOSIS — Z1151 Encounter for screening for human papillomavirus (HPV): Secondary | ICD-10-CM | POA: Diagnosis not present

## 2017-02-25 DIAGNOSIS — Z124 Encounter for screening for malignant neoplasm of cervix: Secondary | ICD-10-CM | POA: Diagnosis not present

## 2017-02-25 DIAGNOSIS — Z9889 Other specified postprocedural states: Secondary | ICD-10-CM

## 2017-02-25 DIAGNOSIS — Z0000 Encounter for general adult medical examination without abnormal findings: Secondary | ICD-10-CM

## 2017-02-25 MED ORDER — DULOXETINE HCL 30 MG PO CPEP: 30.0000 mg | ORAL_CAPSULE | Freq: Two times a day (BID) | ORAL | 5 refills | Status: DC

## 2017-02-25 NOTE — Progress Notes (Signed)
   Subjective:    Patient ID: Jessica Herring, female    DOB: 04-14-1956, 61 y.o.   MRN: [id number]  HPI 61 yo lady here for a post op visit. She has had no post op problems. She recently got a steroid injection in her back. She has had a small amount of spotting.   Review of Systems     Objective:   Physical Exam Breathing, conversing, and ambulating normally Well nourished, well hydrated White female, no apparent distress Small normal appearing cervix       Assessment & Plan:  PMB- thin endometrium, negative pathology, negative hysteroscopy I have given her reassurance that she does not have uterine cancer. If the bleeding is still happening in a year, then she will need another u/s. Pap smear today She declines a flu vaccine now I have refilled her generic cymbalta

## 2017-03-03 LAB — CYTOLOGY - PAP
Diagnosis: NEGATIVE
HPV: NOT DETECTED

## 2017-05-23 MED ORDER — CHOLECALCIFEROL 25 MCG (1000 UT) PO TABS
1000.00 | ORAL_TABLET | ORAL | Status: DC
Start: 2017-05-24 — End: 2017-05-23

## 2017-05-23 MED ORDER — BISACODYL 10 MG RE SUPP
10.00 | RECTAL | Status: DC
Start: ? — End: 2017-05-23

## 2017-05-23 MED ORDER — GENERIC EXTERNAL MEDICATION
1.00 | Status: DC
Start: ? — End: 2017-05-23

## 2017-05-23 MED ORDER — DULOXETINE HCL 30 MG PO CPEP
30.00 | ORAL_CAPSULE | ORAL | Status: DC
Start: 2017-05-24 — End: 2017-05-23

## 2017-05-23 MED ORDER — GABAPENTIN 300 MG PO CAPS
300.00 | ORAL_CAPSULE | ORAL | Status: DC
Start: 2017-05-23 — End: 2017-05-23

## 2017-05-23 MED ORDER — POLYSACCHARIDE IRON COMPLEX 150 MG PO CAPS
150.00 | ORAL_CAPSULE | ORAL | Status: DC
Start: 2017-05-24 — End: 2017-05-23

## 2017-05-23 MED ORDER — CYCLOBENZAPRINE HCL 5 MG PO TABS
5.00 | ORAL_TABLET | ORAL | Status: DC
Start: ? — End: 2017-05-23

## 2017-05-23 MED ORDER — OXYCODONE HCL 5 MG PO TABS
5.00 | ORAL_TABLET | ORAL | Status: DC
Start: ? — End: 2017-05-23

## 2017-05-23 MED ORDER — ALUMINUM-MAGNESIUM-SIMETHICONE 200-200-20 MG/5ML PO SUSP
30.00 | ORAL | Status: DC
Start: ? — End: 2017-05-23

## 2017-05-23 MED ORDER — SENNOSIDES-DOCUSATE SODIUM 8.6-50 MG PO TABS
2.00 | ORAL_TABLET | ORAL | Status: DC
Start: 2017-05-23 — End: 2017-05-23

## 2017-05-23 MED ORDER — ONDANSETRON 4 MG PO TBDP
4.00 | ORAL_TABLET | ORAL | Status: DC
Start: ? — End: 2017-05-23

## 2017-05-23 MED ORDER — ACETAMINOPHEN 325 MG PO TABS
650.00 | ORAL_TABLET | ORAL | Status: DC
Start: 2017-05-23 — End: 2017-05-23

## 2017-05-23 MED ORDER — CALCIUM CITRATE 200 MG PO TABS
950.00 | ORAL_TABLET | ORAL | Status: DC
Start: 2017-05-24 — End: 2017-05-23

## 2017-05-23 MED ORDER — VITAMIN C 500 MG PO TABS
500.00 | ORAL_TABLET | ORAL | Status: DC
Start: 2017-05-23 — End: 2017-05-23

## 2017-09-24 IMAGING — US US TRANSVAGINAL NON-OB
1 series · 14 of 25 positions shown · non-contrast
Comparison: None

CLINICAL DATA: Postmenopausal bleeding episode 1 week ago



[Series 1: us transvaginal non-ob · 0.28mm/px · 14 of 58 slices shown]
[im 1/58]
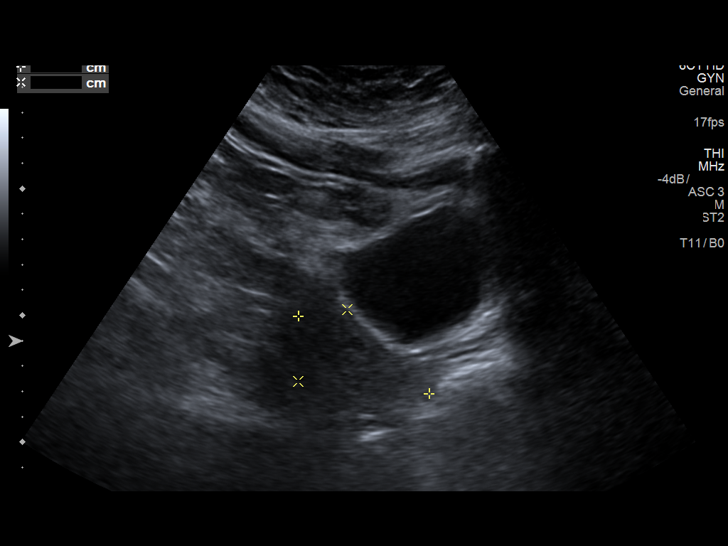
[im 5/58]
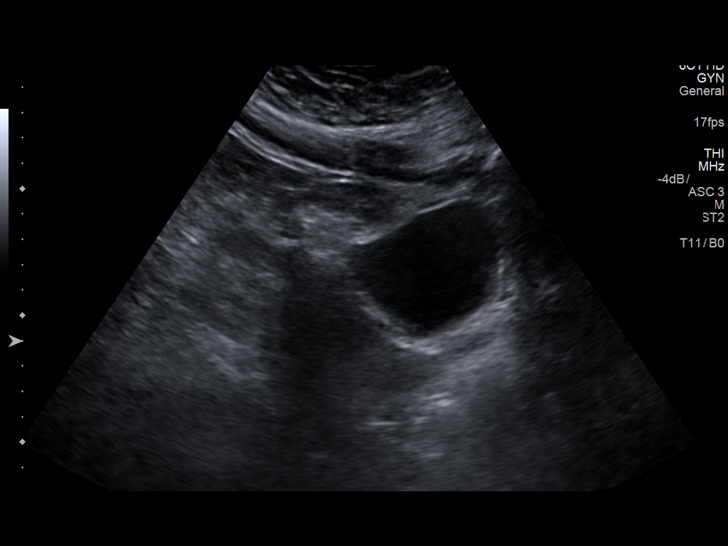
[im 10/58]
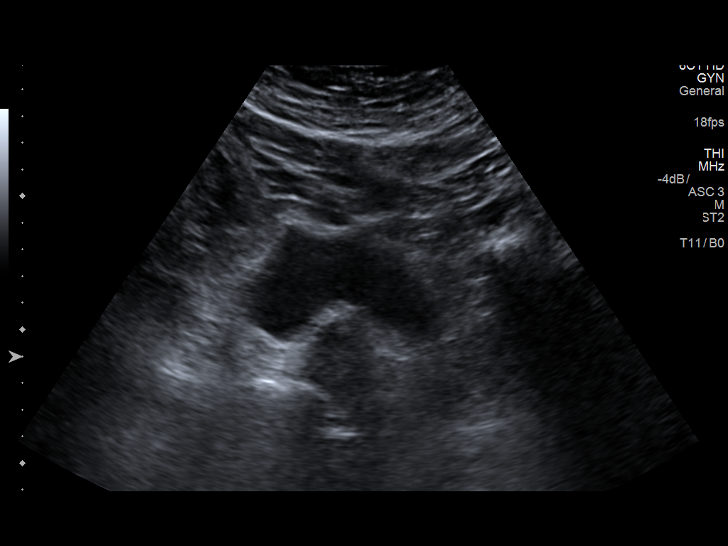
[im 15/58]
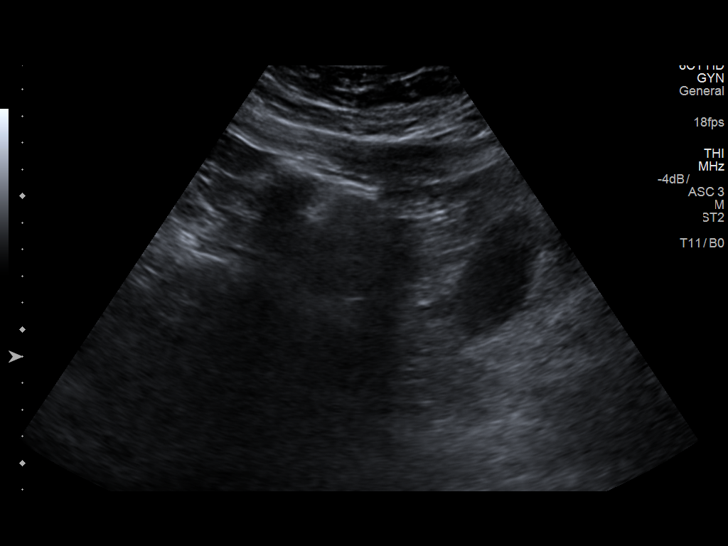
[im 20/58]
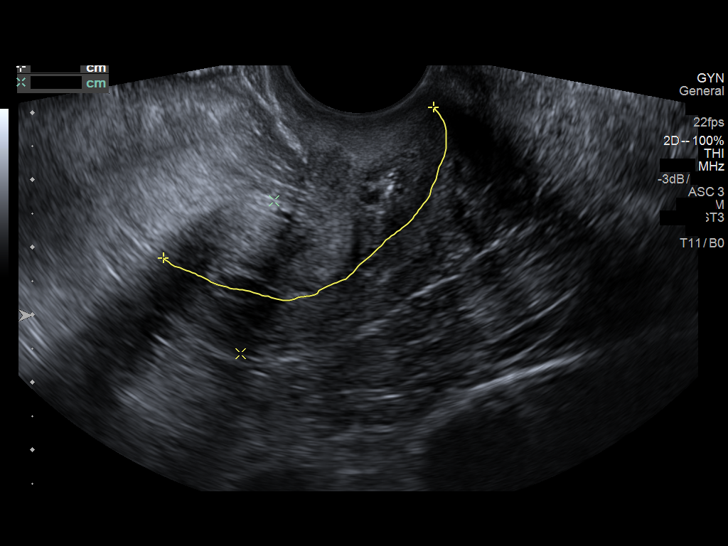
[im 22/58]
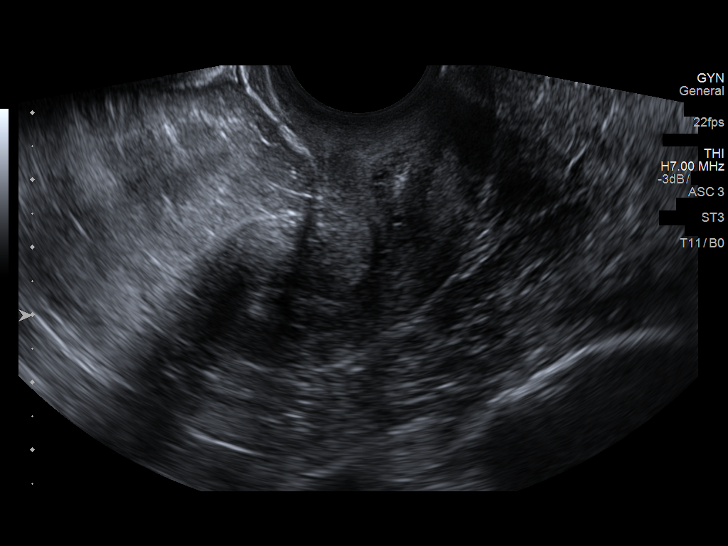
[im 27/58]
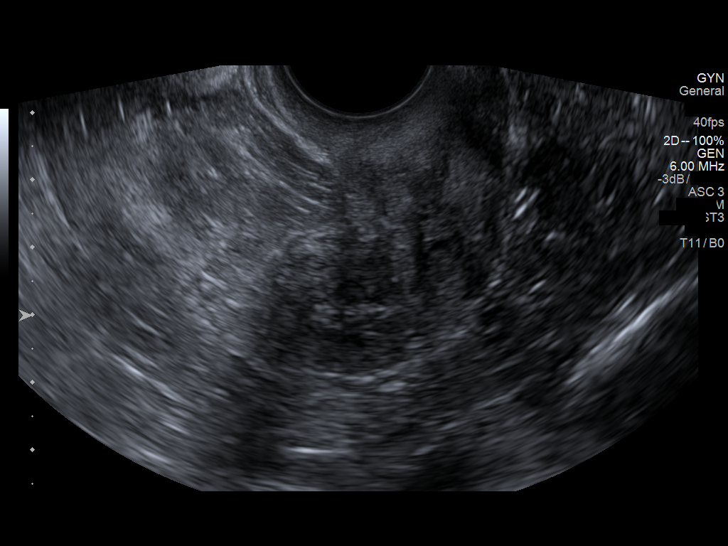
[im 31/58]
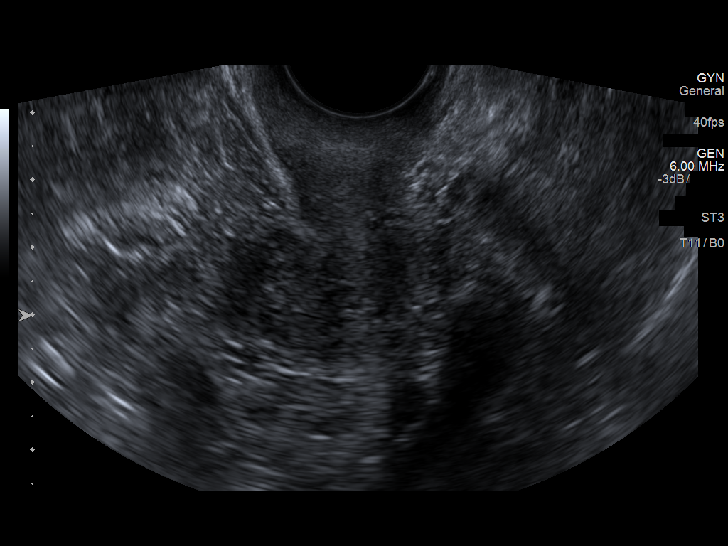
[im 36/58]
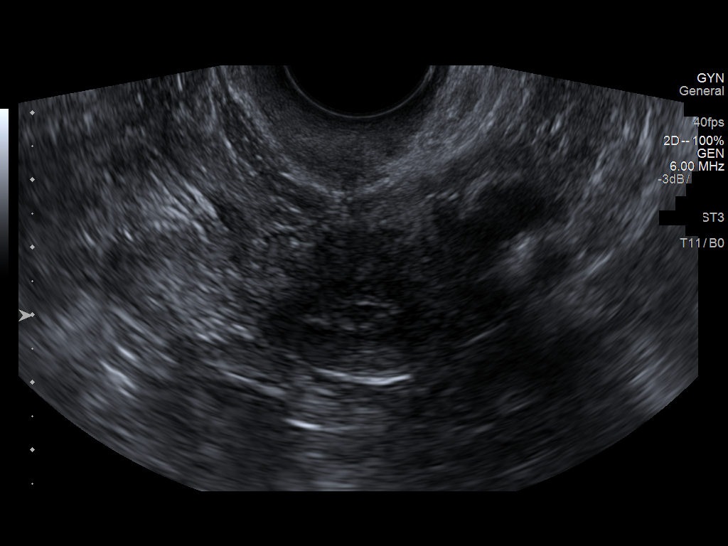
[im 39/58]
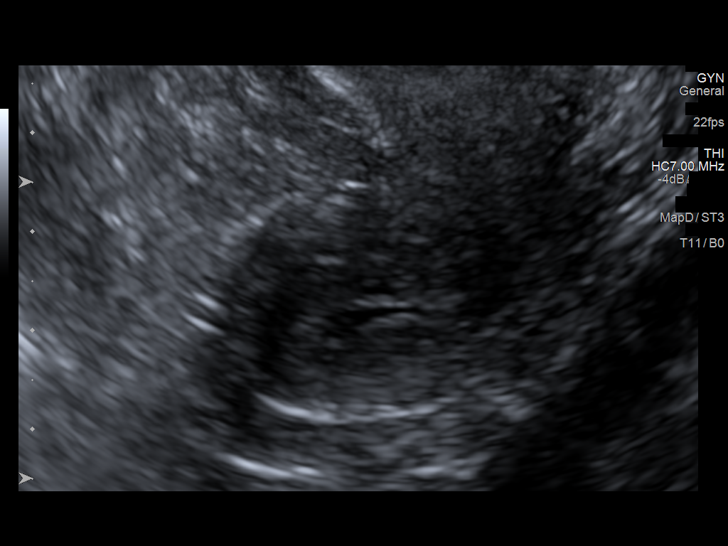
[im 43/58]
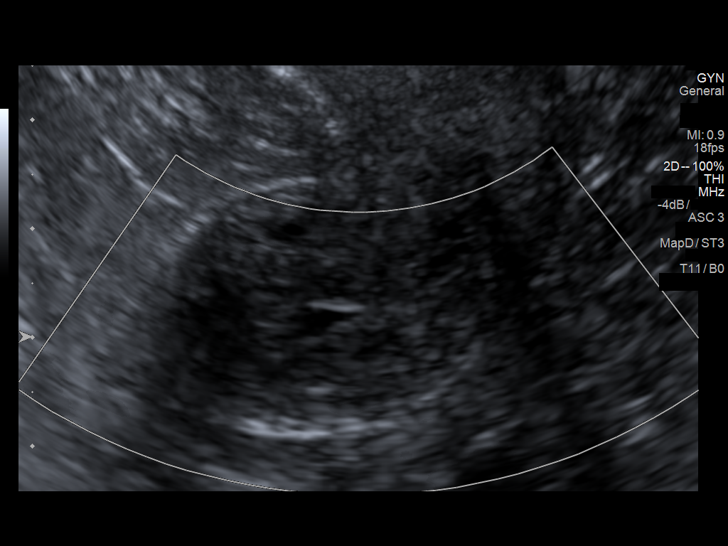
[im 48/58]
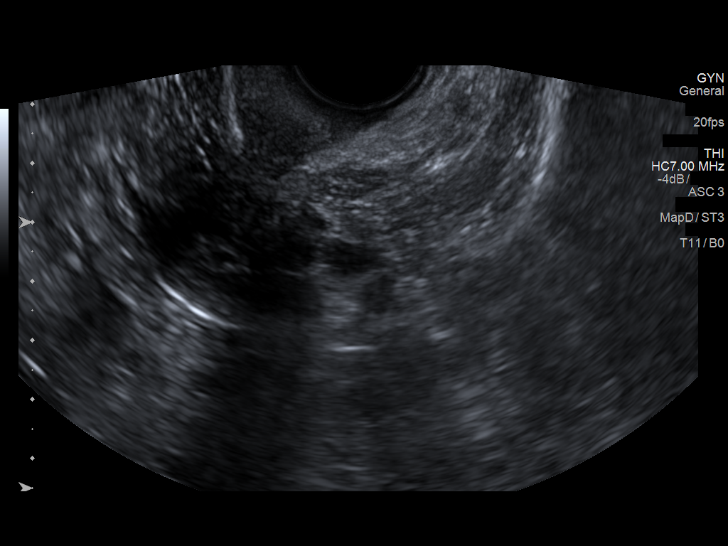
[im 53/58]
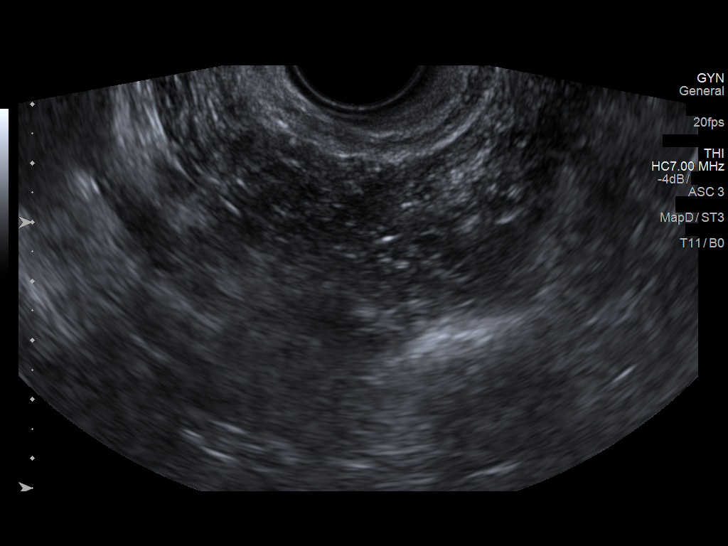
[im 58/58]
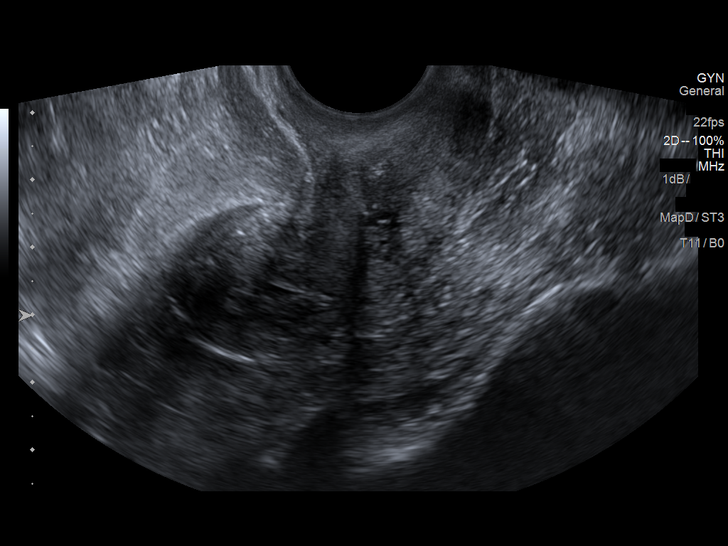

[14 of 25 positions shown; findings below may reference images not displayed]

FINDINGS: Uterus

Measurements: 6.1 x 2.3 x 2.9 cm. Normal morphology without mass

Endometrium

Thickness: 3 mm thick, normal. Small amount of nonspecific
endometrial fluid at upper uterine segment. No other focal
abnormalities.

Right ovary

Not visualized on either transabdominal or endovaginal imaging
question obscured by bowel versus atrophic

Left ovary

Not visualized on either transabdominal or endovaginal imaging
question obscured by bowel versus atrophic

Other findings

No free pelvic fluid or adnexal masses.
IMPRESSION: Small amount of nonspecific endometrial fluid with normal
endometrial thickness.

Nonvisualization of ovaries.

## 2017-10-19 ENCOUNTER — Other Ambulatory Visit: Payer: Self-pay

## 2017-10-19 NOTE — Telephone Encounter (Signed)
Pt called stating that her insurance needs a prior authorization on her Cymbalta before they can refill it. [facility].

## 2017-10-26 ENCOUNTER — Encounter: Payer: Self-pay | Admitting: *Deleted

## 2017-11-02 ENCOUNTER — Encounter: Payer: Self-pay | Admitting: *Deleted

## 2018-01-12 ENCOUNTER — Other Ambulatory Visit: Payer: Self-pay | Admitting: Obstetrics & Gynecology
